# Patient Record
Sex: Female | Born: 1980 | Race: Black or African American | Hispanic: No | Marital: Single | Smoking: Former smoker
Health system: Southern US, Community
[De-identification: ages and names within clinical notes are randomized; demographics above are authoritative.]

## PROBLEM LIST (undated history)

## (undated) DIAGNOSIS — E119 Type 2 diabetes mellitus without complications: Secondary | ICD-10-CM

## (undated) HISTORY — PX: NO PAST SURGERIES: SHX2092

---

## 2003-05-31 ENCOUNTER — Emergency Department (HOSPITAL_COMMUNITY): Admission: EM | Admit: 2003-05-31 | Discharge: 2003-06-01 | Payer: Self-pay | Admitting: Emergency Medicine

## 2003-09-01 ENCOUNTER — Emergency Department (HOSPITAL_COMMUNITY): Admission: EM | Admit: 2003-09-01 | Discharge: 2003-09-01 | Payer: Self-pay | Admitting: Emergency Medicine

## 2012-02-18 DIAGNOSIS — R7989 Other specified abnormal findings of blood chemistry: Secondary | ICD-10-CM | POA: Insufficient documentation

## 2015-07-05 DIAGNOSIS — N939 Abnormal uterine and vaginal bleeding, unspecified: Secondary | ICD-10-CM | POA: Insufficient documentation

## 2015-07-05 DIAGNOSIS — Z6841 Body Mass Index (BMI) 40.0 and over, adult: Secondary | ICD-10-CM | POA: Insufficient documentation

## 2020-05-06 ENCOUNTER — Encounter (HOSPITAL_COMMUNITY): Payer: Self-pay | Admitting: Emergency Medicine

## 2020-05-06 ENCOUNTER — Emergency Department (HOSPITAL_COMMUNITY): Payer: No Typology Code available for payment source

## 2020-05-06 ENCOUNTER — Other Ambulatory Visit: Payer: Self-pay

## 2020-05-06 ENCOUNTER — Emergency Department (HOSPITAL_COMMUNITY)
Admission: EM | Admit: 2020-05-06 | Discharge: 2020-05-06 | Disposition: A | Discharge status: Home / Self Care | Payer: No Typology Code available for payment source | Attending: Emergency Medicine | Admitting: Emergency Medicine

## 2020-05-06 DIAGNOSIS — R0789 Other chest pain: Secondary | ICD-10-CM | POA: Diagnosis present

## 2020-05-06 DIAGNOSIS — Z5321 Procedure and treatment not carried out due to patient leaving prior to being seen by health care provider: Secondary | ICD-10-CM | POA: Diagnosis not present

## 2020-05-06 LAB — CBC
HCT: 39.7 % (ref 36.0–46.0)
Hemoglobin: 12.5 g/dL (ref 12.0–15.0)
MCH: 24.1 pg — ABNORMAL LOW (ref 26.0–34.0)
MCHC: 31.5 g/dL (ref 30.0–36.0)
MCV: 76.6 fL — ABNORMAL LOW (ref 80.0–100.0)
Platelets: 394 10*3/uL (ref 150–400)
RBC: 5.18 MIL/uL — ABNORMAL HIGH (ref 3.87–5.11)
RDW: 15.5 % (ref 11.5–15.5)
WBC: 12 10*3/uL — ABNORMAL HIGH (ref 4.0–10.5)
nRBC: 0 % (ref 0.0–0.2)

## 2020-05-06 LAB — BASIC METABOLIC PANEL
Anion gap: 10 (ref 5–15)
BUN: 11 mg/dL (ref 6–20)
CO2: 23 mmol/L (ref 22–32)
Calcium: 8.9 mg/dL (ref 8.9–10.3)
Chloride: 102 mmol/L (ref 98–111)
Creatinine, Ser: 0.74 mg/dL (ref 0.44–1.00)
GFR calc Af Amer: >60 mL/min (ref >60)
GFR calc non Af Amer: >60 mL/min (ref >60)
Glucose, Bld: 188 mg/dL — ABNORMAL HIGH (ref 70–99)
Potassium: 3.8 mmol/L (ref 3.5–5.1)
Sodium: 135 mmol/L (ref 135–145)

## 2020-05-06 LAB — I-STAT BETA HCG BLOOD, ED (MC, WL, AP ONLY): I-stat hCG, quantitative: <5 m[IU]/mL (ref <5)

## 2020-05-06 LAB — TROPONIN I (HIGH SENSITIVITY): Troponin I (High Sensitivity): 4 ng/L (ref <18)

## 2020-05-06 NOTE — ED Notes (Signed)
Patient states she wants to check out d/t wait time

## 2020-05-06 NOTE — ED Triage Notes (Signed)
Pt has c/o of chest pain that began this evening. Chest pain is centralized and when it worsens radiates to arm.

## 2020-06-30 DIAGNOSIS — E119 Type 2 diabetes mellitus without complications: Secondary | ICD-10-CM | POA: Insufficient documentation

## 2021-01-19 ENCOUNTER — Other Ambulatory Visit: Payer: Self-pay

## 2021-01-19 ENCOUNTER — Inpatient Hospital Stay (HOSPITAL_COMMUNITY)
Admission: AD | Admit: 2021-01-19 | Discharge: 2021-01-19 | Disposition: A | Discharge status: Home / Self Care | Payer: Medicaid Other | Attending: Obstetrics & Gynecology | Admitting: Obstetrics & Gynecology

## 2021-01-19 ENCOUNTER — Inpatient Hospital Stay (HOSPITAL_COMMUNITY): Payer: Medicaid Other

## 2021-01-19 ENCOUNTER — Encounter (HOSPITAL_COMMUNITY): Payer: Self-pay | Admitting: Obstetrics & Gynecology

## 2021-01-19 DIAGNOSIS — E1165 Type 2 diabetes mellitus with hyperglycemia: Secondary | ICD-10-CM | POA: Diagnosis not present

## 2021-01-19 DIAGNOSIS — O200 Threatened abortion: Secondary | ICD-10-CM | POA: Diagnosis not present

## 2021-01-19 DIAGNOSIS — Z3A08 8 weeks gestation of pregnancy: Secondary | ICD-10-CM | POA: Diagnosis not present

## 2021-01-19 DIAGNOSIS — Z87891 Personal history of nicotine dependence: Secondary | ICD-10-CM | POA: Insufficient documentation

## 2021-01-19 DIAGNOSIS — O209 Hemorrhage in early pregnancy, unspecified: Secondary | ICD-10-CM | POA: Diagnosis present

## 2021-01-19 DIAGNOSIS — O24111 Pre-existing diabetes mellitus, type 2, in pregnancy, first trimester: Secondary | ICD-10-CM | POA: Diagnosis not present

## 2021-01-19 DIAGNOSIS — Z6790 Unspecified blood type, Rh positive: Secondary | ICD-10-CM

## 2021-01-19 DIAGNOSIS — O4690 Antepartum hemorrhage, unspecified, unspecified trimester: Secondary | ICD-10-CM

## 2021-01-19 HISTORY — DX: Type 2 diabetes mellitus without complications: E11.9

## 2021-01-19 LAB — CBC
HCT: 38.7 % (ref 36.0–46.0)
HCT: 36.3 % (ref 36.0–46.0)
Hemoglobin: 11.8 g/dL — ABNORMAL LOW (ref 12.0–15.0)
Hemoglobin: 11.4 g/dL — ABNORMAL LOW (ref 12.0–15.0)
MCH: 23.3 pg — ABNORMAL LOW (ref 26.0–34.0)
MCH: 23.9 pg — ABNORMAL LOW (ref 26.0–34.0)
MCHC: 30.5 g/dL (ref 30.0–36.0)
MCHC: 31.4 g/dL (ref 30.0–36.0)
MCV: 76.5 fL — ABNORMAL LOW (ref 80.0–100.0)
MCV: 76.3 fL — ABNORMAL LOW (ref 80.0–100.0)
Platelets: 361 10*3/uL (ref 150–400)
Platelets: 345 10*3/uL (ref 150–400)
RBC: 5.06 MIL/uL (ref 3.87–5.11)
RBC: 4.76 MIL/uL (ref 3.87–5.11)
RDW: 15.8 % — ABNORMAL HIGH (ref 11.5–15.5)
RDW: 15.6 % — ABNORMAL HIGH (ref 11.5–15.5)
WBC: 10.1 10*3/uL (ref 4.0–10.5)
WBC: 12.8 10*3/uL — ABNORMAL HIGH (ref 4.0–10.5)
nRBC: 0 % (ref 0.0–0.2)
nRBC: 0 % (ref 0.0–0.2)

## 2021-01-19 LAB — COMPREHENSIVE METABOLIC PANEL
ALT: 12 U/L (ref 0–44)
AST: 14 U/L — ABNORMAL LOW (ref 15–41)
Albumin: 3.4 g/dL — ABNORMAL LOW (ref 3.5–5.0)
Alkaline Phosphatase: 47 U/L (ref 38–126)
Anion gap: 7 (ref 5–15)
BUN: 7 mg/dL (ref 6–20)
CO2: 24 mmol/L (ref 22–32)
Calcium: 8.7 mg/dL — ABNORMAL LOW (ref 8.9–10.3)
Chloride: 103 mmol/L (ref 98–111)
Creatinine, Ser: 0.6 mg/dL (ref 0.44–1.00)
GFR, Estimated: >60 mL/min (ref >60)
Glucose, Bld: 128 mg/dL — ABNORMAL HIGH (ref 70–99)
Potassium: 3.6 mmol/L (ref 3.5–5.1)
Sodium: 134 mmol/L — ABNORMAL LOW (ref 135–145)
Total Bilirubin: 0.4 mg/dL (ref 0.3–1.2)
Total Protein: 6.7 g/dL (ref 6.5–8.1)

## 2021-01-19 LAB — HEMOGLOBIN A1C
Hgb A1c MFr Bld: 10.2 % — ABNORMAL HIGH (ref 4.8–5.6)
Mean Plasma Glucose: 246.04 mg/dL

## 2021-01-19 LAB — ABO/RH: ABO/RH(D): A POS

## 2021-01-19 LAB — HCG, QUANTITATIVE, PREGNANCY: hCG, Beta Chain, Quant, S: 7034 m[IU]/mL — ABNORMAL HIGH (ref <5)

## 2021-01-19 LAB — HIV ANTIBODY (ROUTINE TESTING W REFLEX): HIV Screen 4th Generation wRfx: NONREACTIVE

## 2021-01-19 MED ORDER — ACETAMINOPHEN 500 MG PO TABS
1000.0000 mg | ORAL_TABLET | Freq: Once | ORAL | Status: AC
  Administered 2021-01-19: 1000 mg via ORAL
  Filled 2021-01-19: qty 2

## 2021-01-19 MED ORDER — CYCLOBENZAPRINE HCL 5 MG PO TABS
10.0000 mg | ORAL_TABLET | Freq: Once | ORAL | Status: AC
  Administered 2021-01-19: 10 mg via ORAL
  Filled 2021-01-19: qty 2

## 2021-01-19 NOTE — MAU Note (Signed)
Pt passed couple more small clots. New pad given and EBL was 157 for total of 334 this visit.

## 2021-01-19 NOTE — MAU Provider Note (Signed)
History     CSN: [id number]  Arrival date and time: 01/19/21 1457   Event Date/Time   First Provider Initiated Contact with Patient 01/19/21 1519      Chief Complaint  Patient presents with  . Vaginal Bleeding   Ms. Cassandra Hodges is a 40 y.o. G6P0050 at [redacted]w[redacted]d who arrived by EMS who presents to MAU for vaginal bleeding which began this morning. Patient reports initially the bleeding was just spotting and light pink, so she went to work as usual, but then the bleeding kept getting heavier and heavier, and patient stated she started feeling nauseous and drove home. By the time she got home, her clothes were saturated in blood, so she called 911 and was brought to MAU for evaluation. Patient also reports cramping that she describes as 8/10. Patient reports she did not take anything for the pain before she left.  Passing blood clots? Per above Blood soaking clothes? yes Lightheaded/dizzy? Minor lightheadedness at work that past and is not present at this time Significant pelvic pain or cramping? no Passed any tissue? 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 Pt has only been seen by [facility] Blood Type? A Positive Allergies? NKDA Current medications? none Current PNC & next appt? Pt has not yet been seen  Pt denies vaginal discharge/odor/itching. Pt denies N/V, abdominal pain, constipation, diarrhea, or urinary problems. Pt denies fever, chills, fatigue, sweating or changes in appetite. Pt denies SOB or chest pain. Pt denies dizziness, HA, light-headedness, weakness.   OB History    Gravida  6   Para      Term      Preterm      AB  5   Living        SAB  5   IAB      Ectopic      Multiple      Live Births              Past Medical History:  Diagnosis Date  . Diabetes mellitus without complication [facility])     Past Surgical History:  Procedure  Laterality Date  . NO PAST SURGERIES      History reviewed. No pertinent family history.  Social History   Tobacco Use  . Smoking status: Former Smoker    Quit date: 12/20/2020    Years since quitting: 0.0  . Smokeless tobacco: Never Used  Vaping Use  . Vaping Use: Never used  Substance Use Topics  . Alcohol use: Not Currently  . Drug use: Never    Allergies: No Known Allergies  No medications prior to admission.    Review of Systems  Constitutional: Negative for chills, diaphoresis, fatigue and fever.  Eyes: Negative for visual disturbance.  Respiratory: Negative for shortness of breath.   Cardiovascular: Negative for chest pain.  Gastrointestinal: Negative for abdominal pain, constipation, diarrhea, nausea and vomiting.  Genitourinary: Positive for vaginal bleeding. Negative for dysuria, flank pain, frequency, pelvic pain, urgency and vaginal discharge.  Neurological: Negative for dizziness, weakness, light-headedness and headaches.   Physical Exam   Blood pressure 116/75, pulse 76, temperature 97.8 F (36.6 C), resp. rate 18, last menstrual period 11/21/2020.  Patient Vitals for the past 24 hrs:  BP Temp Pulse Resp  01/19/21 2101 116/75 -- 76 18  01/19/21 1510 139/79 97.8 F (36.6 C) 88 18   Physical Exam Vitals and nursing note reviewed. Exam conducted with a  chaperone present.  Constitutional:      General: She is not in acute distress.    Appearance: Normal appearance. She is not ill-appearing, toxic-appearing or diaphoretic.  HENT:     Head: Normocephalic and atraumatic.  Pulmonary:     Effort: Pulmonary effort is normal.  Abdominal:     Palpations: Abdomen is soft.  Genitourinary:    General: Normal vulva.     Labia:        Right: No rash, tenderness or lesion.        Left: No rash, tenderness or lesion.      Vagina: Bleeding present. No vaginal discharge or lesions.     Cervix: Normal.     Comments: On initial speculum exam, large clot present at  introitus and vagina full with blood. Blood cleared with 4 swabs and clot removed. No POCs present at external os. Small amount of bright red bleeding present at external os after clearing contents of vagina. Cervix appears visually closed. Skin:    General: Skin is warm and dry.  Neurological:     Mental Status: She is alert and oriented to person, place, and time.  Psychiatric:        Mood and Affect: Mood normal.        Behavior: Behavior normal.        Thought Content: Thought content normal.        Judgment: Judgment normal.    Results for orders placed or performed during the hospital encounter of 01/19/21 (from the past 24 hour(s))  CBC     Status: Abnormal   Collection Time: 01/19/21  3:42 PM  Result Value Ref Range   WBC 10.1 4.0 - 10.5 K/uL   RBC 5.06 3.87 - 5.11 MIL/uL   Hemoglobin 11.8 (L) 12.0 - 15.0 g/dL   HCT 16.138.7 09.636.0 - 04.546.0 %   MCV 76.5 (L) 80.0 - 100.0 fL   MCH 23.3 (L) 26.0 - 34.0 pg   MCHC 30.5 30.0 - 36.0 g/dL   RDW 40.915.8 (H) 81.111.5 - 91.415.5 %   Platelets 361 150 - 400 K/uL   nRBC 0.0 0.0 - 0.2 %  Comprehensive metabolic panel     Status: Abnormal   Collection Time: 01/19/21  3:42 PM  Result Value Ref Range   Sodium 134 (L) 135 - 145 mmol/L   Potassium 3.6 3.5 - 5.1 mmol/L   Chloride 103 98 - 111 mmol/L   CO2 24 22 - 32 mmol/L   Glucose, Bld 128 (H) 70 - 99 mg/dL   BUN 7 6 - 20 mg/dL   Creatinine, Ser 7.820.60 0.44 - 1.00 mg/dL   Calcium 8.7 (L) 8.9 - 10.3 mg/dL   Total Protein 6.7 6.5 - 8.1 g/dL   Albumin 3.4 (L) 3.5 - 5.0 g/dL   AST 14 (L) 15 - 41 U/L   ALT 12 0 - 44 U/L   Alkaline Phosphatase 47 38 - 126 U/L   Total Bilirubin 0.4 0.3 - 1.2 mg/dL   GFR, Estimated >95>60 >62>60 mL/min   Anion gap 7 5 - 15  hCG, quantitative, pregnancy     Status: Abnormal   Collection Time: 01/19/21  3:42 PM  Result Value Ref Range   hCG, Beta Chain, Quant, S 7,034 (H) <5 mIU/mL  ABO/Rh     Status: None   Collection Time: 01/19/21  3:42 PM  Result Value Ref Range   ABO/RH(D)  A POS    No rh immune globuloin  NOT A RH IMMUNE GLOBULIN CANDIDATE, PT RH POSITIVE Performed at [facility], [address], [place], [place] [address]   HIV Antibody (routine testing w rflx)     Status: None   Collection Time: 01/19/21  3:42 PM  Result Value Ref Range   HIV Screen 4th Generation wRfx Non Reactive Non Reactive  Hemoglobin A1c     Status: Abnormal   Collection Time: 01/19/21  3:42 PM  Result Value Ref Range   Hgb A1c MFr Bld 10.2 (H) 4.8 - 5.6 %   Mean Plasma Glucose 246.04 mg/dL  CBC     Status: Abnormal   Collection Time: 01/19/21  7:59 PM  Result Value Ref Range   WBC 12.8 (H) 4.0 - 10.5 K/uL   RBC 4.76 3.87 - 5.11 MIL/uL   Hemoglobin 11.4 (L) 12.0 - 15.0 g/dL   HCT 41.9 62.2 - 29.7 %   MCV 76.3 (L) 80.0 - 100.0 fL   MCH 23.9 (L) 26.0 - 34.0 pg   MCHC 31.4 30.0 - 36.0 g/dL   RDW 98.9 (H) 21.1 - 94.1 %   Platelets 345 150 - 400 K/uL   nRBC 0.0 0.0 - 0.2 %   US OB LESS THAN 14 WEEKS WITH OB TRANSVAGINAL  Result Date: 01/19/2021 CLINICAL DATA:  Vaginal bleeding in first trimester of pregnancy, LMP 11/21/2020; no quantitative beta HCG currently available for comparison EXAM: OBSTETRIC <14 WK [place] AND TRANSVAGINAL OB US TECHNIQUE: Both transabdominal and transvaginal ultrasound examinations were performed for complete evaluation of the gestation as well as the maternal uterus, adnexal regions, and pelvic cul-de-sac. Transvaginal technique was performed to assess early pregnancy. COMPARISON:  None FINDINGS: Intrauterine gestational sac: Present, single, abnormal in position at lower uterine segment endometrial canal, slightly irregular Yolk sac:  Absent Embryo:  Present Cardiac Activity: Absent Heart Rate: N/A  bpm CRL:  3.8 mm   6 w   0 d                  [place] EDC: 09/14/2021 Subchorionic hemorrhage: Ovoid area of abnormal heterogeneous material within endometrial canal 2.8 x 1.9 x 1.8 cm, likely hemorrhage/clot. Maternal uterus/adnexae: Remainder of uterus  unremarkable. Nabothian cysts noted at cervix. LEFT ovary normal size and morphology 3.0 x 1.6 x 2.7 cm. RIGHT ovary not visualized. No free pelvic fluid or adnexal masses. IMPRESSION: Gestational sac seen within the lower uterine segment endometrial canal, slightly irregular, containing a fetal pole which lacks fetal cardiac activity. No yolk sac visualized. Probable hemorrhage/clot within endometrial canal above gestational sac. Findings are suspicious but not yet definitive for failed pregnancy; question spontaneous abortion in progress. Recommend follow-up US in 10-14 days for definitive diagnosis. This recommendation follows SRU consensus guidelines: Diagnostic Criteria for Nonviable Pregnancy Early in the First Trimester. Malva Limes Med 2013; 740:8144-81. Electronically Signed   By: Ulyses Southward M.D.   On: 01/19/2021 17:15    MAU Course  Procedures  MDM -r/o ectopic -CBC: WNL for pregnancy -CMP: no abnormalities requiring treatment -[place]: Gestational sac seen within the lower uterine segment endometrial canal, slightly irregular, containing a fetal pole which lacks fetal cardiac activity. No yolk sac visualized. Probable hemorrhage/clot within endometrial canal above gestational sac. Findings are suspicious but not yet definitive for failed pregnancy; question spontaneous abortion in progress.  -hCG: 7,034 -ABO: A Positive -consulted with Dr. Crissie Reese, will not administer cytotec at this time, but will monitor patient for approx 4 hours for blood loss and recheck CBC  and if patient continues to be stable and asymptomatic will send home; also discussed patient's inability to obtain insulin and given glucose 128 on CMP today, will schedule pt with Dr. Crissie Reese for f/u on likely SAB and insulin management -EBL: -repeat CBC 4hours later: H/H 11.4/36.3 (was 11.8/38.7) -Flexeril and Tylenol given for pain per pt request -pt discharged to home in stable condition  Orders Placed This Encounter   Procedures  . US OB LESS THAN 14 WEEKS WITH OB TRANSVAGINAL    Standing Status:   Standing    Number of Occurrences:   1    Order Specific Question:   Symptom/Reason for Exam    Answer:   Vaginal bleeding in pregnancy [705036]  . US OB LESS THAN 14 WEEKS WITH OB TRANSVAGINAL    Standing Status:   Future    Standing Expiration Date:   01/19/2022    Order Specific Question:   Reason for Exam (SYMPTOM  OR DIAGNOSIS REQUIRED)    Answer:   viability    Order Specific Question:   Preferred Imaging Location?    Answer:   [facility]  . CBC    Standing Status:   Standing    Number of Occurrences:   1  . Comprehensive metabolic panel    Standing Status:   Standing    Number of Occurrences:   1  . hCG, quantitative, pregnancy    Standing Status:   Standing    Number of Occurrences:   1  . HIV Antibody (routine testing w rflx)    Standing Status:   Standing    Number of Occurrences:   1  . Hemoglobin A1c    Standing Status:   Standing    Number of Occurrences:   1  . CBC    Standing Status:   Standing    Number of Occurrences:   1  . ABO/Rh    Standing Status:   Standing    Number of Occurrences:   1  . Discharge patient    Order Specific Question:   Discharge disposition    Answer:   01-Home or Self Care [1]    Order Specific Question:   Discharge patient date    Answer:   01/19/2021   Meds ordered this encounter  Medications  . acetaminophen (TYLENOL) tablet 1,000 mg  . cyclobenzaprine (FLEXERIL) tablet 10 mg   Assessment and Plan   1. Threatened miscarriage   2. Vaginal bleeding in pregnancy   3. Blood type, Rh positive   4. Uncontrolled type 2 diabetes mellitus with hyperglycemia (HCC)    Allergies as of 01/19/2021   No Known Allergies     Medication List    You have not been prescribed any medications.     -message sent to [facility] importance to schedule patient with Dr. Crissie Reese for MAU f/u/start insulin -f/u US scheduled in 10-14 days -return MAU  precautions given -work note provided per pt request -pt discharged to home in stable condition  Odie Sera Kaydra Borgen 01/19/2021, 9:17 PM

## 2021-01-19 NOTE — Progress Notes (Signed)
Written and verbal d/c instructions given and understanding voiced. 

## 2021-01-19 NOTE — MAU Note (Signed)
EMS arrival. Pt stated she was not feeling well at work and having some cramping. Went home and when she went to [facility] she passed a golf ball sized clot and started having heavier vaginal bleeding.  [facility] ( confirmed by EMS pt left paperwork at home) Gestational sac seen.

## 2021-01-19 NOTE — Discharge Instructions (Signed)
Threatened Miscarriage A threatened miscarriage occurs when a woman has vaginal bleeding during the first 20 weeks of pregnancy but the pregnancy has not ended. If vaginal bleeding occurs during this time, the health care provider will do tests to make sure the woman is still pregnant. The woman's condition may be considered a threatened miscarriage if the tests show:  That she is still pregnant.  That the embryo or unborn baby (fetus) inside the uterus is still growing. A threatened miscarriage does not mean your pregnancy will end, but it does increase the risk of losing your pregnancy (miscarriage). What are the causes? The cause of this condition is usually not known. What increases the risk? The following factors may make a pregnant woman more likely to have a miscarriage: Certain medical conditions  Conditions that affect the hormone balance in the body, such as thyroid disease or polycystic ovary syndrome.  Diabetes.  Autoimmune disorders.  Infections.  Bleeding disorders.  Obesity. Lifestyle factors  Using products with tobacco or nicotine or being exposed to tobacco smoke.  Having alcohol.  Having large amounts of caffeine.  Recreational drug use. Problems with reproductive organs or structures  Cervical insufficiency. This is when the the lowest part of the uterus (cervix) opens and thins before pregnancy is at term.  Having a condition called Asherman syndrome, which causes scarring in the uterus or causes the uterus to be abnormal in structure.  Fibrous growths, called fibroids, in the uterus.  Congenital abnormalities. These problems are present at birth.  Infection of the cervix or uterus. Personal or medical history  Injury (trauma).  Having had a miscarriage before.  Being younger than age 63 or older than age 47.  Exposure to harmful substances in the environment. This may include radiation or heavy metals, such as lead.  Using certain  medicines. What are the signs or symptoms? Symptoms of this condition include:  Vaginal bleeding or spotting, with or without cramps or pain.  Mild pain or cramps in your abdomen. How is this diagnosed? You may have tests to check whether you are still pregnant. These tests will be done if you have bleeding, with or without pain, in your abdomen before the 20th week of pregnancy. These tests include:  Ultrasound.  A physical exam.  Measurement of your baby's heart rate.  Lab tests, such as blood tests, urine tests, or swabs for infection. You may be diagnosed with a threatened miscarriage if:  Ultrasound testing shows that you are still pregnant.  Your baby's heart rate is strong.  A physical exam shows that your cervix is closed.  Blood tests confirm that you are still pregnant.   How is this treated? No treatments have been shown to prevent a threatened miscarriage from going on to a complete miscarriage. However, the right home care is important. Follow these instructions at home:  Get plenty of rest.  Do not have sex, douche, or put anything in your vagina, such as tampons, until your health care provider says it is okay.  Do not smoke or use recreational drugs.  Do not drink alcohol.  Avoid caffeine.  Keep all follow-up prenatal visits. This is important. Contact a health care provider if:  You have light vaginal bleeding or spotting while pregnant.  You have pain or cramping in your abdomen.  You have a fever. Get help right away if:  Heavy bleeding soaks through 2 large sanitary pads an hour for more than 2 hours.  Blood clots come out of  your vagina.  Tissue comes out of your vagina.  You leak fluid, or you have a gush of fluid from your vagina.  You have severe low back pain or cramps in your abdomen.  You have a fever, chills, and severe pain in the abdomen. Summary  A threatened miscarriage occurs when a woman bleeds from the vagina during the  first 20 weeks of pregnancy but the pregnancy has not ended.  The cause of a threatened miscarriage is usually not known.  Symptoms of this condition may include vaginal bleeding and mild pain or cramps in your abdomen.  No treatments have been shown to prevent a threatened miscarriage from going on to a complete miscarriage.  Keep all follow-up prenatal visits. This is important. This information is not intended to replace advice given to you by your health care provider. Make sure you discuss any questions you have with your health care provider. Document Revised: 03/04/2020 Document Reviewed: 03/04/2020 Elsevier Patient Education  2021 Elsevier Inc.        Vaginal Bleeding During Pregnancy, First Trimester A small amount of bleeding from the vagina, or spotting, is common during early pregnancy. Some bleeding may be related to the pregnancy, and some may not. In many cases, the bleeding is normal and is not a problem. However, bleeding can also be a sign of something serious. Normal things that may cause bleeding during the first trimester:  Implantation of the fertilized egg in the lining of the uterus.  Rapid changes in blood vessels. This is caused by changes that are happening to the body during pregnancy.  Sex.  Pelvic exams. Abnormal things that may cause bleeding during the first trimester include:  Infection or inflammation of the cervix.  Growths or polyps on the cervix.  Miscarriage or threatened miscarriage.  Pregnancy that is growing outside of the uterus (ectopic pregnancy).  A fertilized egg that becomes a mass of tissue (molar pregnancy). Tell your health care provider right away if there is any bleeding from your vagina. Follow these instructions at home: Monitoring your bleeding Monitor your bleeding.  Pay attention to any changes in your symptoms. Let your health care provider know about any concerns.  Try to understand when the bleeding occurs.  Does the bleeding start on its own, or does it start after something is done, such as sex or a pelvic exam?  Use a diary to record the things you see about your bleeding, including: ? The kind of bleeding you are having. Does the bleeding start and stop irregularly, or is it a constant flow? ? The severity of your bleeding. Is the bleeding heavy or light? ? The number of pads you use each day, how often you change them, and how soaked they are.  Tell your health care provider if you pass tissue. He or she may want to see it.   Activity  Follow instructions from your health care provider about limiting your activity. Ask what activities are safe for you.  Do not have sex until your health care provider says that this is safe.  If needed, make plans for someone to help with your regular activities. General instructions  Take over-the-counter and prescription medicines only as told by your health care provider.  Do not take aspirin because it can cause bleeding.  Do not use tampons or douche.  Keep all follow-up visits. This is important. Contact a health care provider if:  You have vaginal bleeding during any part of your pregnancy.  You have cramps or labor pains.  You have a fever or chills. Get help right away if:  You have severe cramps in your back or abdomen.  You pass large clots or a large amount of tissue from your vagina.  Your bleeding increases.  You feel light-headed or weak, or you faint.  You are leaking fluid or have a gush of fluid from your vagina. Summary  A small amount of bleeding from the vagina is common during early pregnancy.  Be sure to tell your health care provider about any vaginal bleeding right away.  Try to understand when bleeding occurs. Does bleeding occur on its own, or does it occur after something is done, such as sex or pelvic exams?  Keep all follow-up visits. This is important. This information is not intended to replace advice  given to you by your health care provider. Make sure you discuss any questions you have with your health care provider. Document Revised: 05/26/2020 Document Reviewed: 05/26/2020 Elsevier Patient Education  2021 Elsevier Inc.                        Safe Medications in Pregnancy    Acne: Benzoyl Peroxide Salicylic Acid  Backache/Headache: Tylenol: 2 regular strength every 4 hours OR              2 Extra strength every 6 hours  Colds/Coughs/Allergies: Benadryl (alcohol free) 25 mg every 6 hours as needed Breath right strips Claritin Cepacol throat lozenges Chloraseptic throat spray Cold-Eeze- up to three times per day Cough drops, alcohol free Flonase (by prescription only) Guaifenesin Mucinex Robitussin DM (plain only, alcohol free) Saline nasal spray/drops Sudafed (pseudoephedrine) & Actifed ** use only after [redacted] weeks gestation and if you do not have high blood pressure Tylenol Vicks Vaporub Zinc lozenges Zyrtec   Constipation: Colace Ducolax suppositories Fleet enema Glycerin suppositories Metamucil Milk of magnesia Miralax Senokot Smooth move tea  Diarrhea: Kaopectate Imodium A-D  *NO pepto Bismol  Hemorrhoids: Anusol Anusol HC Preparation H Tucks  Indigestion: Tums Maalox Mylanta Zantac  Pepcid  Insomnia: Benadryl (alcohol free) 25mg  every 6 hours as needed Tylenol PM Unisom, no Gelcaps  Leg Cramps: Tums MagGel  Nausea/Vomiting:  Bonine Dramamine Emetrol Ginger extract Sea bands Meclizine  Nausea medication to take during pregnancy:  Unisom (doxylamine succinate 25 mg tablets) Take one tablet daily at bedtime. If symptoms are not adequately controlled, the dose can be increased to a maximum recommended dose of two tablets daily (1/2 tablet in the morning, 1/2 tablet mid-afternoon and one at bedtime). Vitamin B6 100mg  tablets. Take one tablet twice a day (up to 200 mg per day).  Skin Rashes: Aveeno products Benadryl cream  or 25mg  every 6 hours as needed Calamine Lotion 1% cortisone cream  Yeast infection: Gyne-lotrimin 7 Monistat 7   **If taking multiple medications, please check labels to avoid duplicating the same active ingredients **take medication as directed on the label ** Do not exceed 4000 mg of tylenol in 24 hours **Do not take medications that contain aspirin or ibuprofen

## 2021-01-31 ENCOUNTER — Encounter: Payer: No Typology Code available for payment source | Admitting: Family Medicine

## 2021-02-08 ENCOUNTER — Encounter: Payer: Self-pay | Admitting: Family Medicine

## 2021-02-08 ENCOUNTER — Ambulatory Visit (INDEPENDENT_AMBULATORY_CARE_PROVIDER_SITE_OTHER): Payer: Self-pay | Admitting: Family Medicine

## 2021-02-08 ENCOUNTER — Other Ambulatory Visit: Payer: Self-pay

## 2021-02-08 ENCOUNTER — Ambulatory Visit
Admission: RE | Admit: 2021-02-08 | Discharge: 2021-02-08 | Disposition: A | Discharge status: Home / Self Care | Payer: Medicaid Other | Source: Ambulatory Visit | Attending: Women's Health | Admitting: Women's Health

## 2021-02-08 VITALS — BP 99/77 | HR 106 | Wt 275.5 lb

## 2021-02-08 DIAGNOSIS — O034 Incomplete spontaneous abortion without complication: Secondary | ICD-10-CM | POA: Insufficient documentation

## 2021-02-08 DIAGNOSIS — O200 Threatened abortion: Secondary | ICD-10-CM | POA: Insufficient documentation

## 2021-02-08 LAB — CBC
Hematocrit: 40.8 % (ref 34.0–46.6)
Hemoglobin: 12.6 g/dL (ref 11.1–15.9)
MCH: 23.5 pg — ABNORMAL LOW (ref 26.6–33.0)
MCHC: 30.9 g/dL — ABNORMAL LOW (ref 31.5–35.7)
MCV: 76 fL — ABNORMAL LOW (ref 79–97)
Platelets: 365 10*3/uL (ref 150–450)
RBC: 5.36 x10E6/uL — ABNORMAL HIGH (ref 3.77–5.28)
RDW: 15.4 % (ref 11.7–15.4)
WBC: 9.8 10*3/uL (ref 3.4–10.8)

## 2021-02-08 MED ORDER — DOXYCYCLINE HYCLATE 100 MG PO CAPS: 100.0000 mg | ORAL_CAPSULE | Freq: Two times a day (BID) | ORAL | 0 refills | Status: AC

## 2021-02-08 MED ORDER — MISOPROSTOL 200 MCG PO TABS: 1000.0000 ug | ORAL_TABLET | Freq: Once | ORAL | 0 refills | Status: AC

## 2021-02-08 NOTE — Assessment & Plan Note (Signed)
U/s does not show true retained POC but some fluid in the cervix. Will give cytotec and doxycycline. Check CBC due to dizziness and on-going bleeding. She is Rh pos. Discussed causes of recurrent AB, AMA and poorly controlled DM. Will begin insulin for her in usual safe dosing for pregnancy once she has her insurance and use condoms for 3 months until A1C is < 7 prior to attempting to conceive.  Return to PCP for other diabetes management.

## 2021-02-08 NOTE — Progress Notes (Signed)
Pt here for OB US results.

## 2021-02-08 NOTE — Progress Notes (Signed)
    Subjective:    Patient ID: Cassandra Hodges is a 40 y.o. female presenting with Results  on 02/08/2021  HPI: [id number] with 5 prior SABs. Has poorly controlled DM, type 2 on no meds due to lack of insurance.Also with AMA. Still having bleeding and an odor. Has never bled this long. Also with some dizziness and light headedness.  Review of Systems  Constitutional: Negative for chills and fever.  Respiratory: Negative for shortness of breath.   Cardiovascular: Negative for chest pain.  Gastrointestinal: Negative for abdominal pain, nausea and vomiting.  Genitourinary: Negative for dysuria.  Skin: Negative for rash.      Objective:    BP 99/77   Pulse (!) 106   Wt 275 lb 8 oz (125 kg)   LMP 11/21/2020   Breastfeeding Unknown   BMI 48.80 kg/m  Physical Exam Constitutional:      General: She is not in acute distress.    Appearance: She is well-developed.  HENT:     Head: Normocephalic and atraumatic.  Eyes:     General: No scleral icterus. Cardiovascular:     Rate and Rhythm: Normal rate.  Pulmonary:     Effort: Pulmonary effort is normal.  Abdominal:     Palpations: Abdomen is soft.  Musculoskeletal:     Cervical back: Neck supple.  Skin:    General: Skin is warm and dry.  Neurological:     Mental Status: She is alert and oriented to person, place, and time.         Assessment & Plan:   Problem List Items Addressed This Visit      Unprioritized   Incomplete abortion - Primary    U/s does not show true retained POC but some fluid in the cervix. Will give cytotec and doxycycline. Check CBC due to dizziness and on-going bleeding. She is Rh pos. Discussed causes of recurrent AB, AMA and poorly controlled DM. Will begin insulin for her in usual safe dosing for pregnancy once she has her insurance and use condoms for 3 months until A1C is < 7 prior to attempting to conceive.  Return to PCP for other diabetes management.      Relevant Medications   doxycycline  (VIBRAMYCIN) 100 MG capsule   misoprostol (CYTOTEC) 200 MCG tablet   Other Relevant Orders   CBC      Return in about 2 weeks (around 02/22/2021) for in person, a follow-up SAB.  Cassandra Hodges 02/08/2021 11:16 AM

## 2021-02-09 ENCOUNTER — Telehealth: Payer: Self-pay | Admitting: Lactation Services

## 2021-02-09 NOTE — Telephone Encounter (Signed)
-----   Message from [name], MD sent at 02/09/2021  7:26 AM EDT ----- She is not anemic, please inform her and help her sign up for myChart

## 2021-02-09 NOTE — Telephone Encounter (Signed)
Called patient to inform her that she is not anemic. Patient voiced understanding.   Offered patient to help her sign up for My Chart. She reports she is not near her laptop. She would like an email sent to [email or web address]. Email sent via My Chart sign up.

## 2021-02-22 ENCOUNTER — Ambulatory Visit: Payer: No Typology Code available for payment source | Admitting: Family Medicine

## 2021-05-18 DIAGNOSIS — E1165 Type 2 diabetes mellitus with hyperglycemia: Secondary | ICD-10-CM | POA: Diagnosis not present

## 2021-05-18 DIAGNOSIS — B373 Candidiasis of vulva and vagina: Secondary | ICD-10-CM | POA: Diagnosis not present

## 2021-05-18 DIAGNOSIS — Z6841 Body Mass Index (BMI) 40.0 and over, adult: Secondary | ICD-10-CM | POA: Diagnosis not present

## 2021-09-22 DIAGNOSIS — U071 COVID-19: Secondary | ICD-10-CM | POA: Diagnosis not present

## 2021-09-22 DIAGNOSIS — R059 Cough, unspecified: Secondary | ICD-10-CM | POA: Diagnosis not present

## 2021-09-22 DIAGNOSIS — R0602 Shortness of breath: Secondary | ICD-10-CM | POA: Diagnosis not present

## 2021-12-05 IMAGING — CR DG CHEST 2V
2 series · 2 of 2 positions shown · non-contrast
Comparison: None.

CLINICAL DATA: Worsening chest pain radiating to the right arm

EXAM:
CHEST - 2 VIEW

[chest pa]
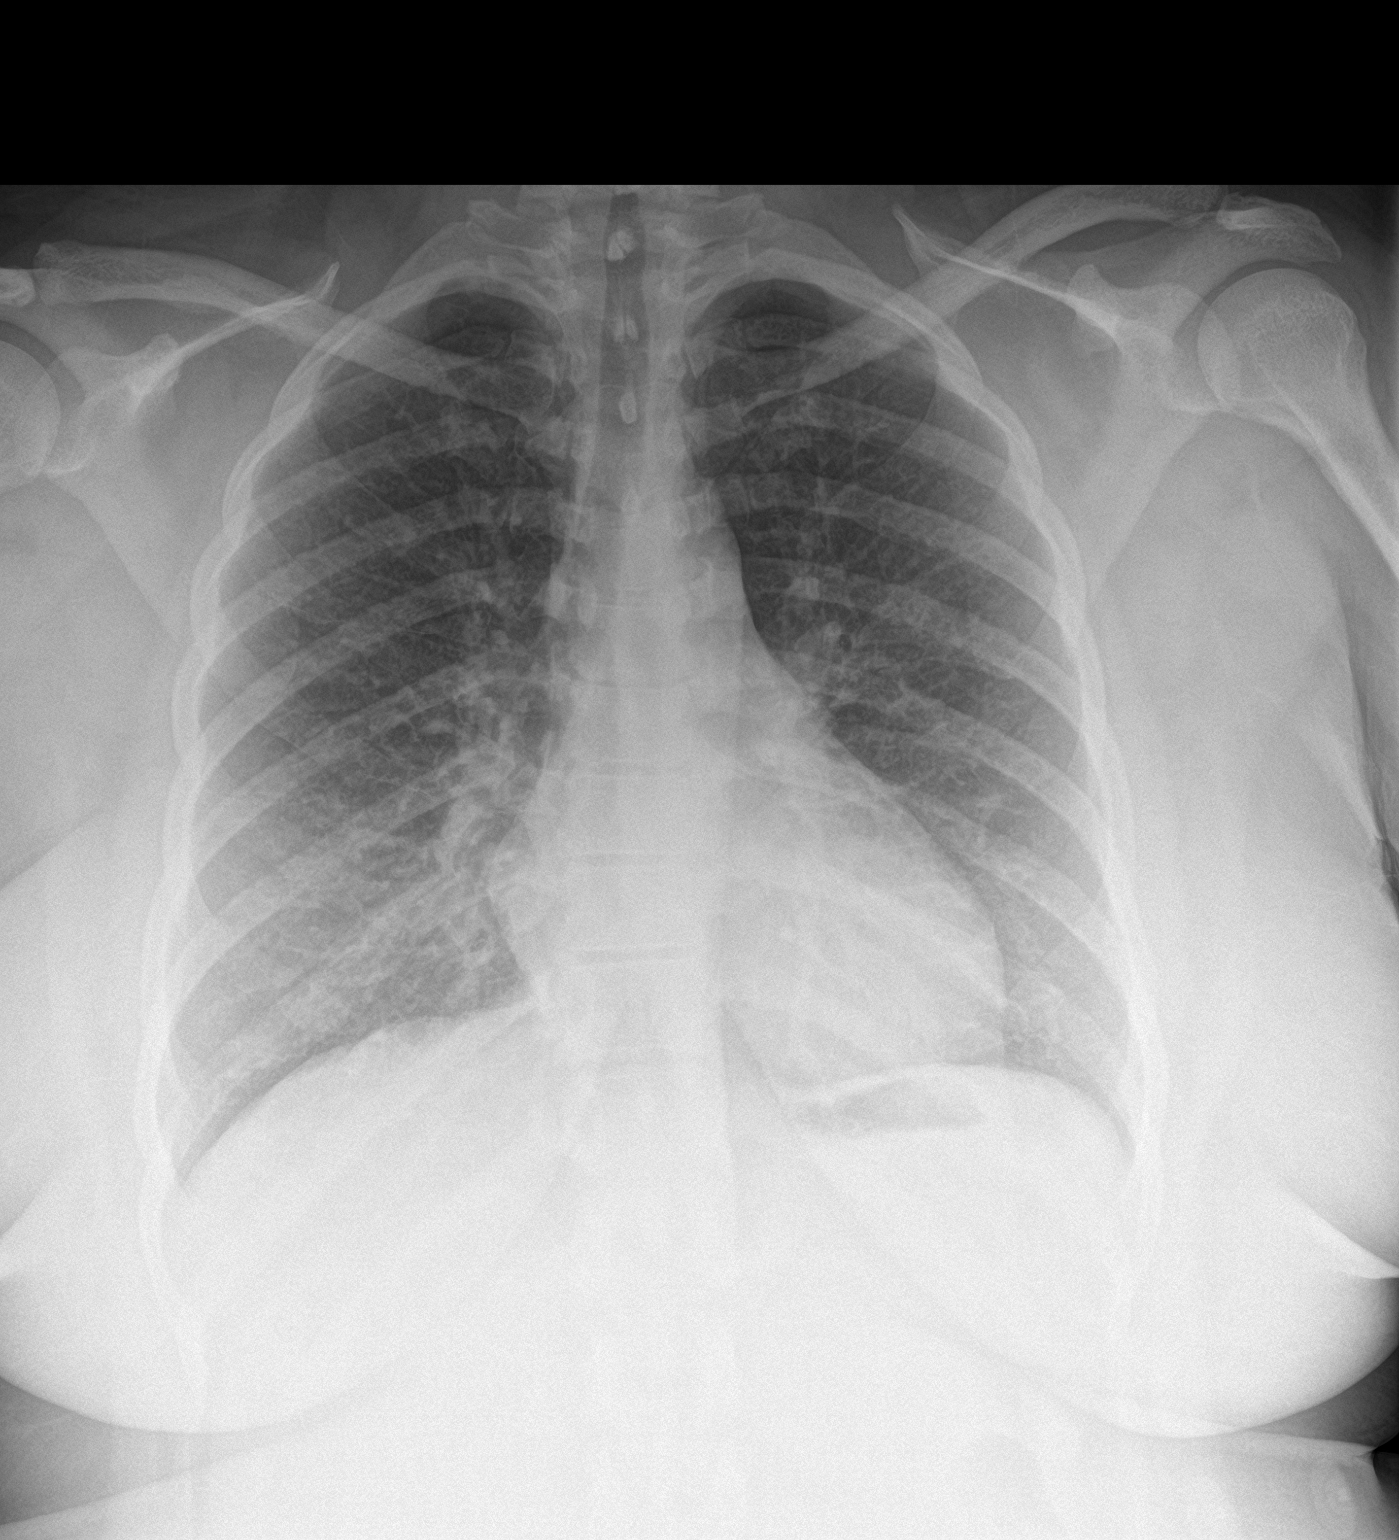

[chest lat]
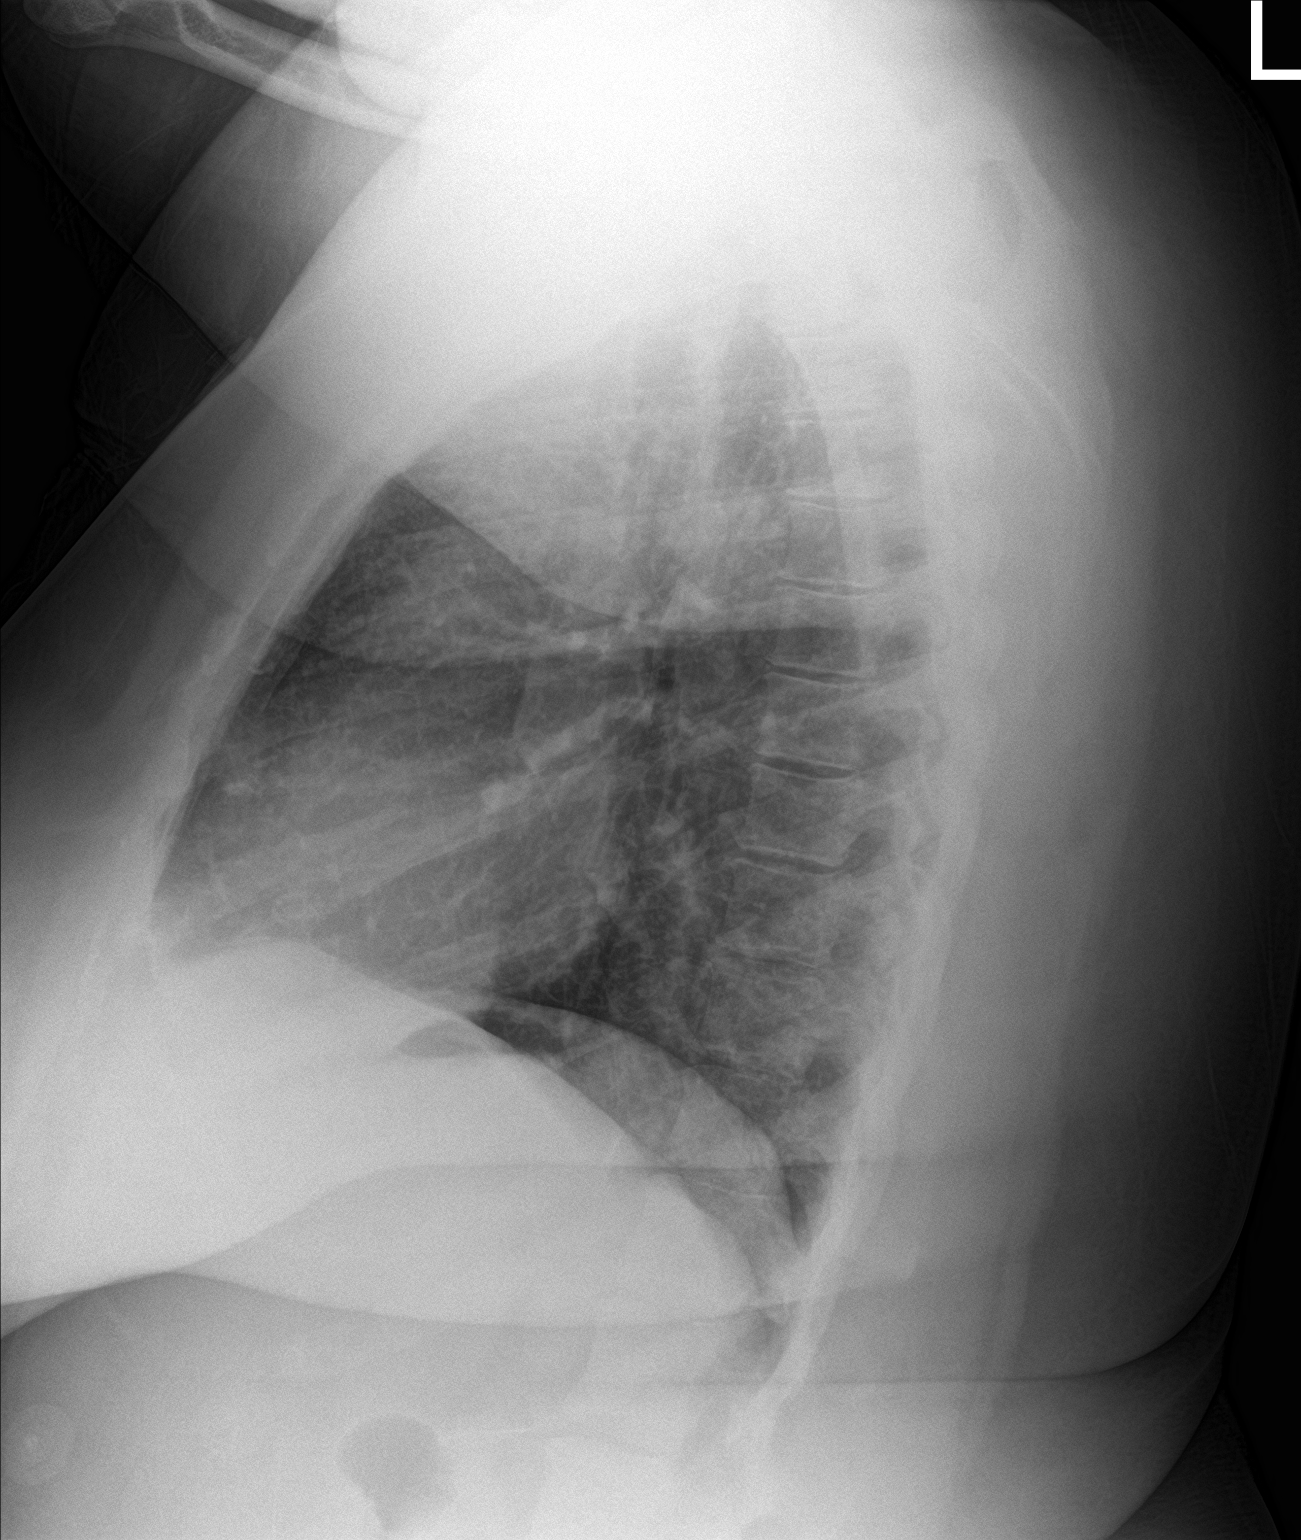

[2 of 2 positions shown; findings below may reference images not displayed]

FINDINGS: Increasing basilar predominant reticular opacities in the lungs. No
focal consolidative process. No pneumothorax or effusion. Pulmonary
vascularity is normally distributed. Cardiomediastinal contours are
unremarkable. No acute osseous or soft tissue abnormality.
IMPRESSION: Basilar predominant reticular opacities favor atelectasis in the
absence of infectious symptoms.

## 2022-09-09 IMAGING — US US OB TRANSVAGINAL
1 series · 15 of 28 positions shown · non-contrast
Comparison: Obstetrical ultrasound 01/19/2021

CLINICAL DATA: 39-year-old female reportedly 8 weeks pregnant with
vaginal bleeding. Quantitative beta HCG 7,034

EXAM:
OBSTETRIC <14 WK US AND TRANSVAGINAL OB US
TECHNIQUE: Both transabdominal and transvaginal ultrasound examinations were
performed for complete evaluation of the gestation as well as the
maternal uterus, adnexal regions, and pelvic cul-de-sac.
Transvaginal technique was performed to assess early pregnancy.

[Series 1: us ob transvaginal · 52 acquisitions, 15 frames shown]
[im 1/52]
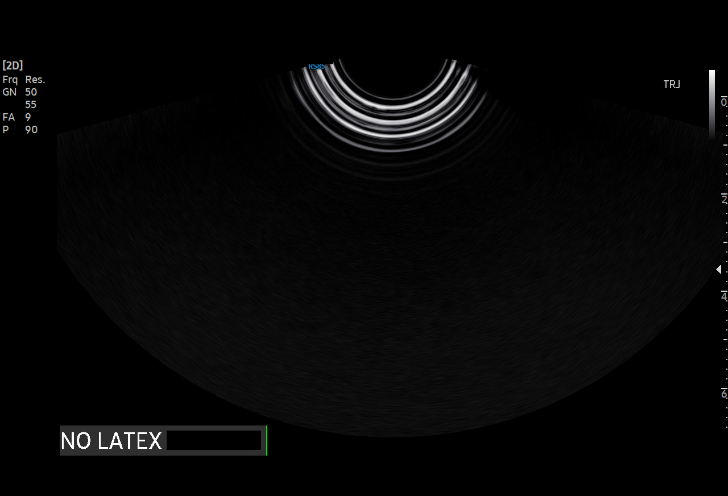
[im 4/52]
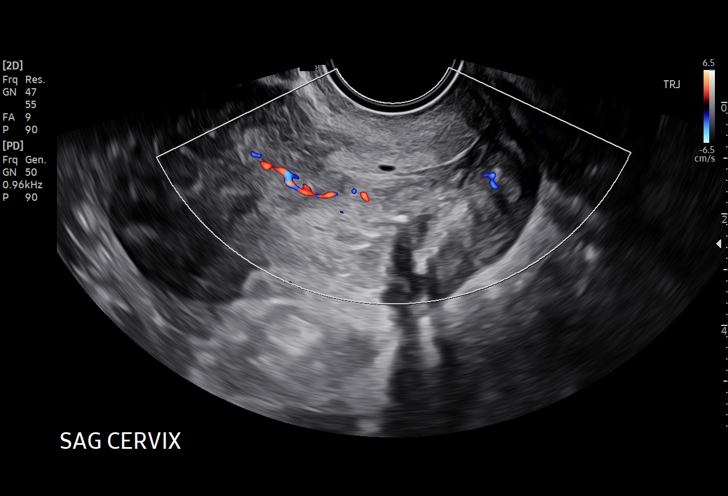
[im 8/52]
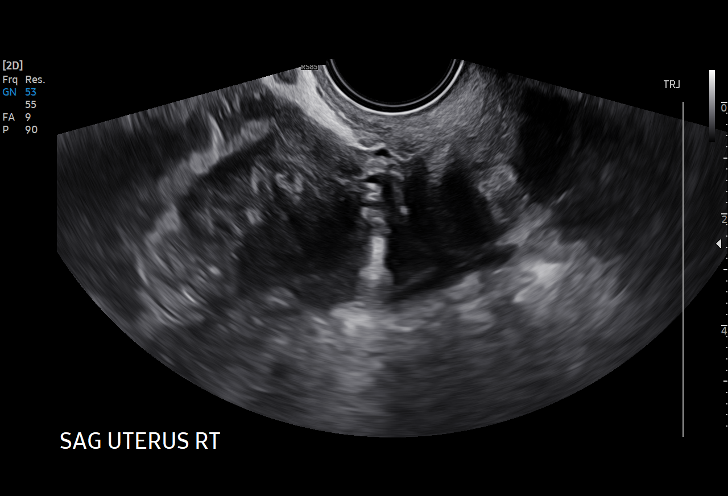
[im 12/52]
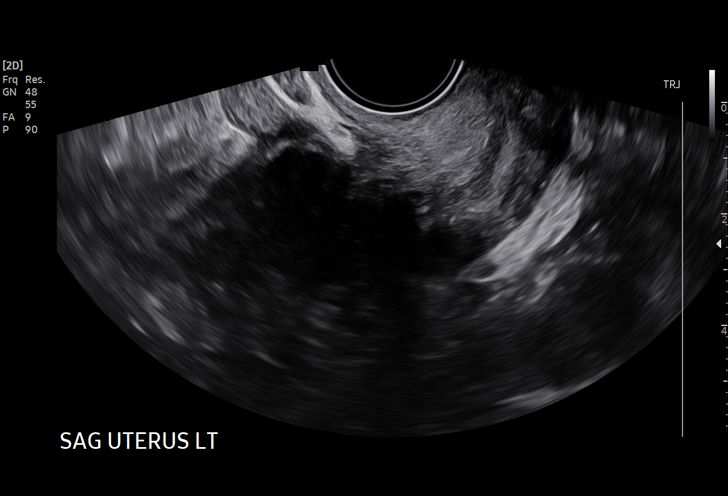
[im 16/52]
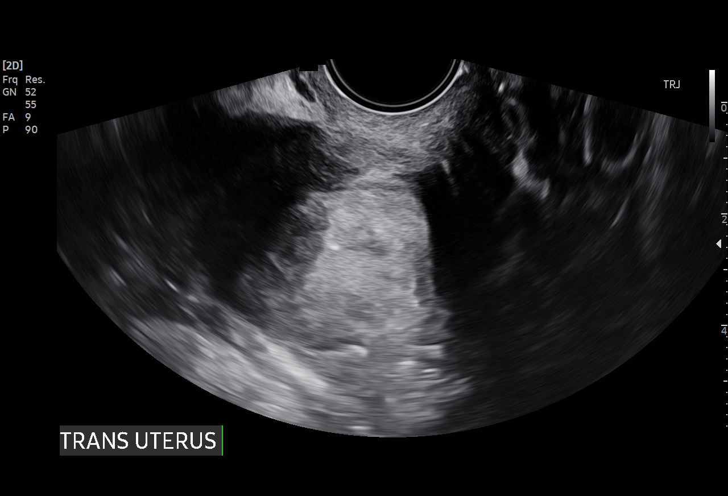
[im 19/52]
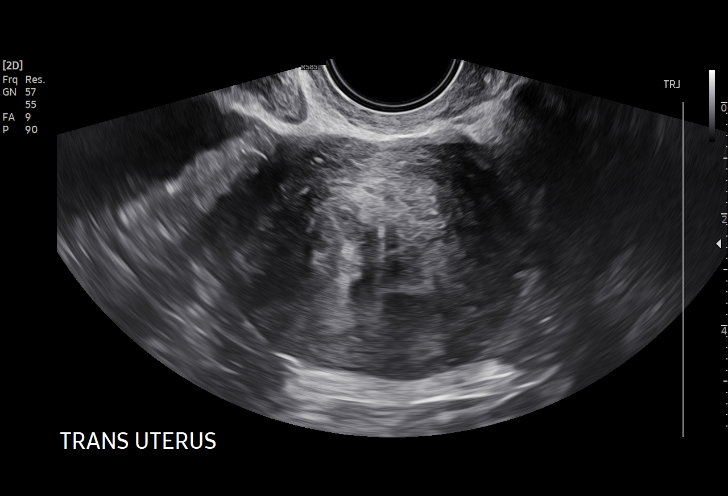
[im 23/52]
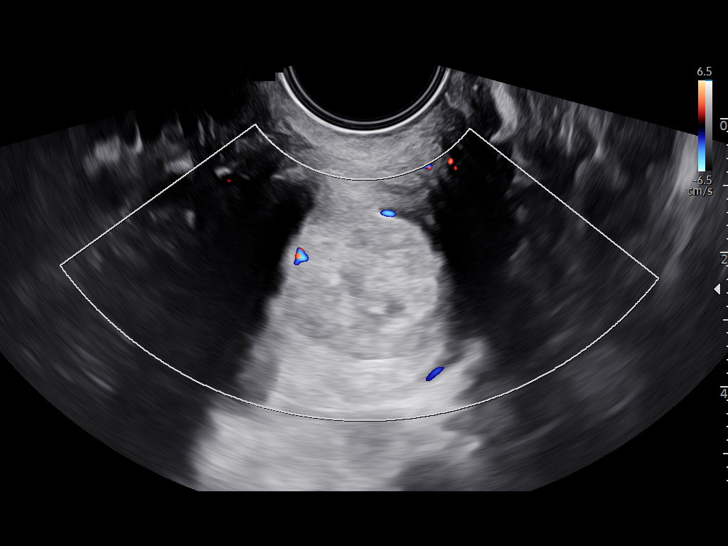
[im 27/52]
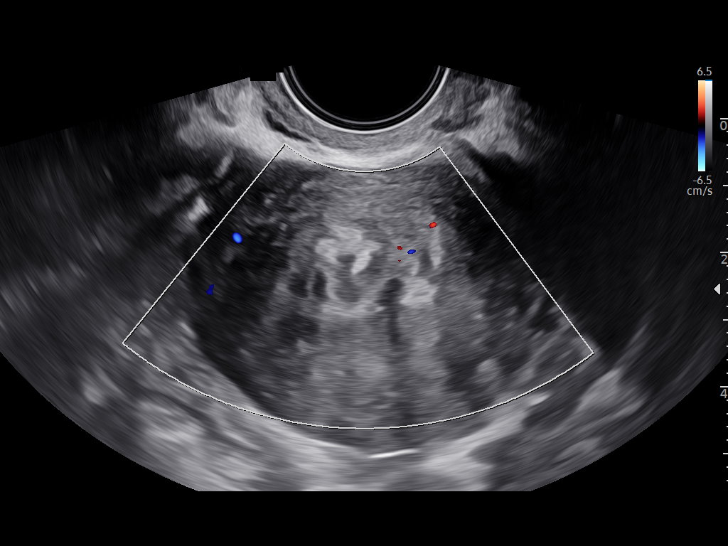
[im 29/52]
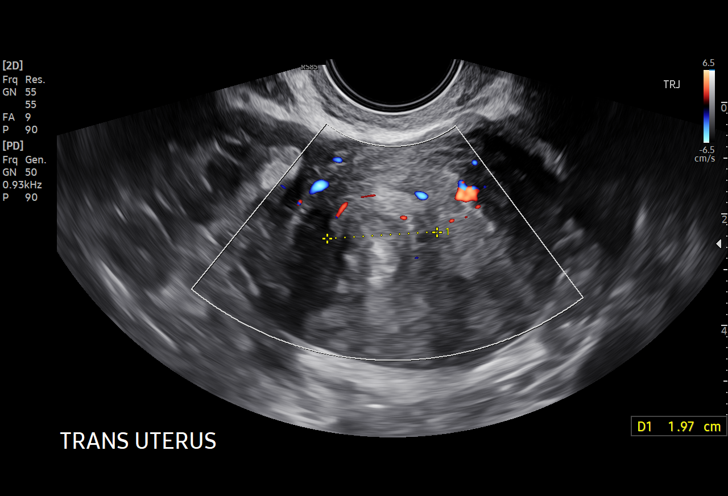
[im 33/52]
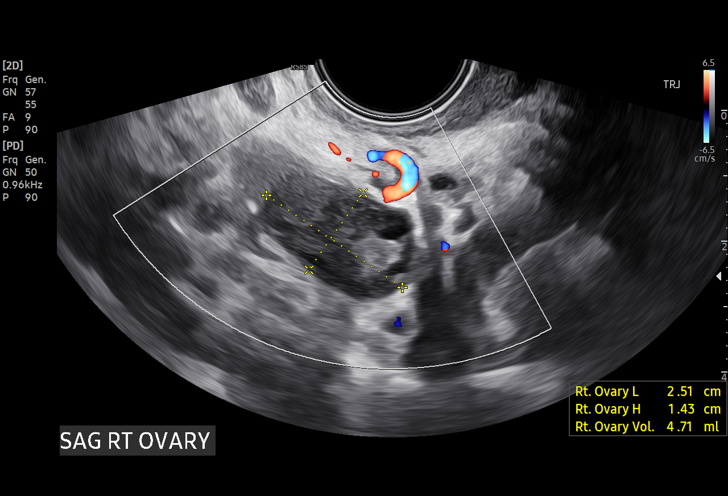
[im 36/52]
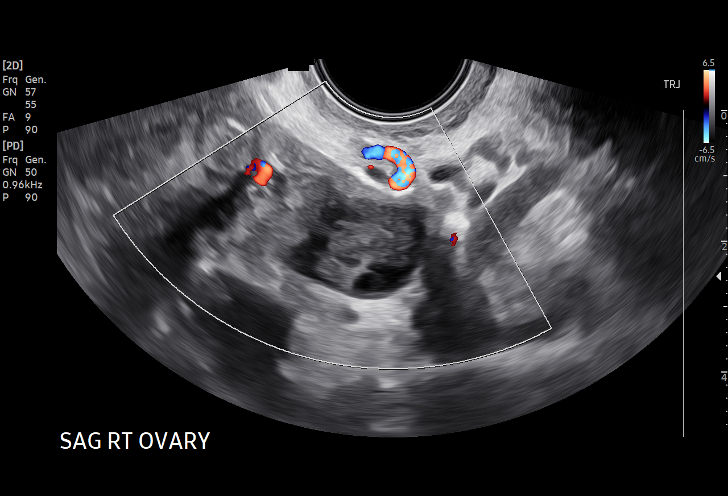
[im 40/52]
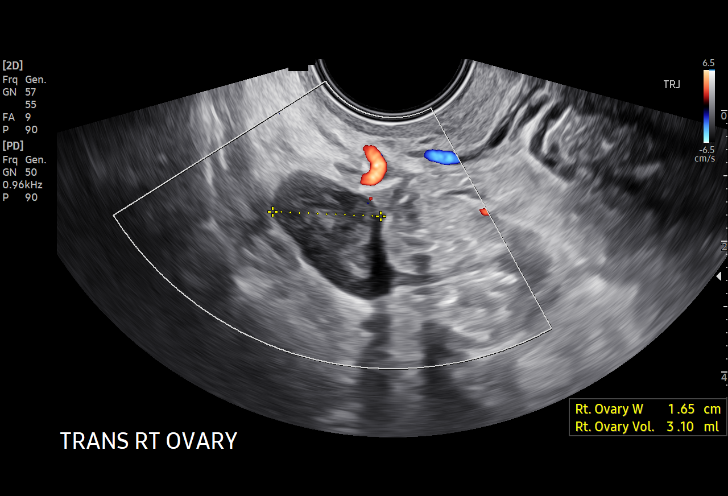
[im 44/52]
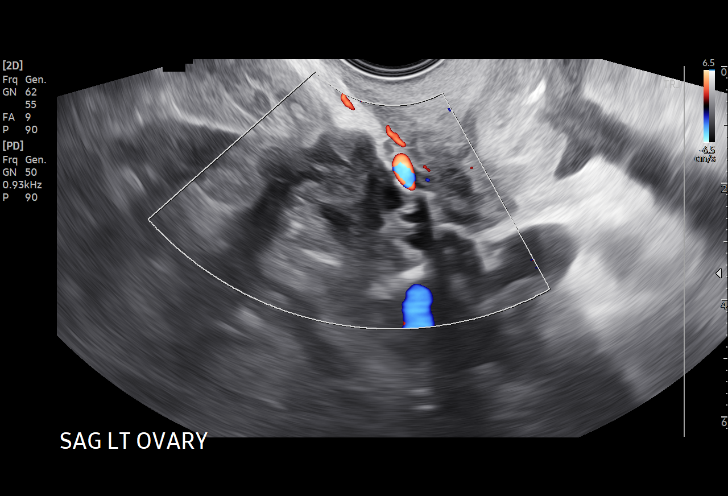
[im 48/52]
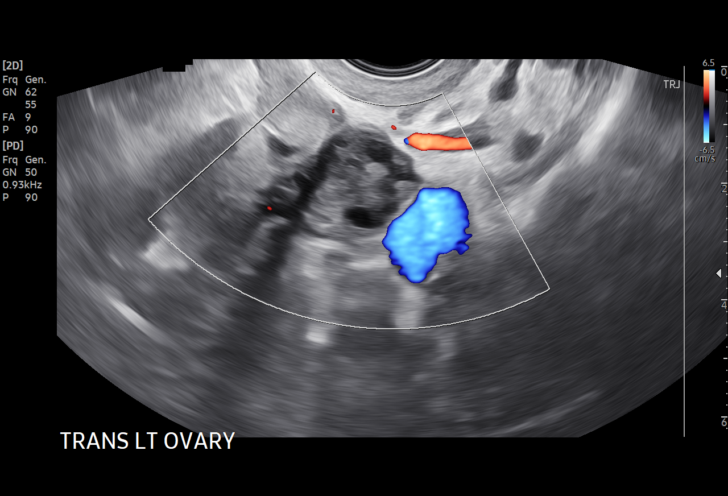
[im 52/52]
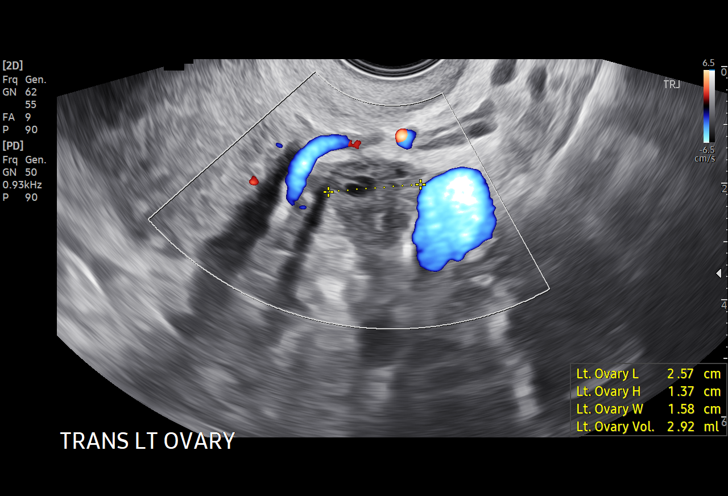

[15 of 28 positions shown; findings below may reference images not displayed]

FINDINGS: Intrauterine gestational sac: None

Yolk sac:  Not Visualized.

Embryo:  Not Visualized.

Cardiac Activity: Not Visualized.

Subchorionic hemorrhage:  None visualized.

Maternal uterus/adnexae: Heterogeneous echogenic solid mass in the
posterior fundus measures 3.3 x 2.0 x 1.9 cm. Finding likely
represents a uterine fibroid. The bilateral ovaries are normal in
appearance. Echogenic material within the cervical canal likely
represents blood products.
IMPRESSION: No intrauterine gestational sac, yolk sac or embryo visualized.
Findings meet definitive criteria for failed pregnancy. This follows
SRU consensus guidelines: Diagnostic Criteria for Nonviable
Pregnancy Early in the First Trimester. N Engl J Med

Posterior fundal uterine fibroid.
